# Patient Record
Sex: Male | Born: 1995 | Race: Black or African American | Hispanic: No | Marital: Single | Smoking: Never smoker
Health system: Southern US, Community
[De-identification: ages and names within clinical notes are randomized; demographics above are authoritative.]

---

## 2018-04-09 ENCOUNTER — Emergency Department (HOSPITAL_COMMUNITY)
Admission: EM | Admit: 2018-04-09 | Discharge: 2018-04-09 | Disposition: A | Discharge status: Home / Self Care | Payer: Medicaid Other | Attending: Emergency Medicine | Admitting: Emergency Medicine

## 2018-04-09 ENCOUNTER — Other Ambulatory Visit: Payer: Self-pay

## 2018-04-09 ENCOUNTER — Encounter (HOSPITAL_COMMUNITY): Payer: Self-pay | Admitting: Emergency Medicine

## 2018-04-09 DIAGNOSIS — L237 Allergic contact dermatitis due to plants, except food: Secondary | ICD-10-CM | POA: Insufficient documentation

## 2018-04-09 NOTE — ED Provider Notes (Signed)
[facility] EMERGENCY DEPARTMENT Provider Note   CSN: [id number] Arrival date & time: 04/09/18  2001     History   Chief Complaint Chief Complaint  Patient presents with  . Poison Ivy    HPI Nechemia E Domagalski is a 22 y.o. male.  22 yo male with rash to face/head onset yesterday after working outside removing weeds/poison ivy. Noticed rash along the right cheek and forehead. States area is not really bothersome, not spreading, needs a note to be able to go back to work and would like to go to work. No history of prior complications with poison ivy, no vision changes. Patient tried applying benadryl to area but found this to be more bothersome. No other complaints or concerns.      History reviewed. No pertinent past medical history.  There are no active problems to display for this patient.   History reviewed. No pertinent surgical history.      Home Medications    Prior to Admission medications   Not on File    Family History No family history on file.  Social History Social History   Tobacco Use  . Smoking status: Never Smoker  . Smokeless tobacco: Never Used  Substance Use Topics  . Alcohol use: Not Currently  . Drug use: Not Currently     Allergies   Patient has no known allergies.   Review of Systems Review of Systems  Constitutional: Negative for fever.  Eyes: Negative for visual disturbance.  Musculoskeletal: Negative for arthralgias, joint swelling and myalgias.  Skin: Positive for rash.  Allergic/Immunologic: Negative for immunocompromised state.  All other systems reviewed and are negative.    Physical Exam Updated Vital Signs BP (!) 146/87 (BP Location: Right Arm)   Pulse 75   Temp 98.3 F (36.8 C) (Oral)   Resp 18   Ht 5\' 11"  (1.803 m)   Wt 99.8 kg (220 lb)   SpO2 100%   BMI 30.68 kg/m   Physical Exam  Constitutional: He is oriented to person, place, and time. He appears well-developed and  well-nourished.  HENT:  Head: Normocephalic and atraumatic.  Eyes: Pupils are equal, round, and reactive to light. Conjunctivae and EOM are normal.  Pulmonary/Chest: Effort normal.  Neurological: He is alert and oriented to person, place, and time.  Skin: Skin is warm and dry. Rash noted.     Psychiatric: He has a normal mood and affect. His behavior is normal.  Nursing note and vitals reviewed.    ED Treatments / Results  Labs (all labs ordered are listed, but only abnormal results are displayed) Labs Reviewed - No data to display  EKG None  Radiology No results found.  Procedures Procedures (including critical care time)  Medications Ordered in ED Medications - No data to display   Initial Impression / Assessment and Plan / ED Course  I have reviewed the triage vital signs and the nursing notes.  Pertinent labs & imaging results that were available during my care of the patient were reviewed by me and considered in my medical decision making (see chart for details).  Clinical Course as of Apr 10 2055  Sat Apr 09, 2018  2054 22yo male with exposure to poison ivy with rash to face, states rash is not bothersome but needs note to go to work. Rash does not appear consistent with shingles, does not appear infection, no involvement of eye lids/periorbital swelling. Recommend conservative/symptomatic treatment- benadryl, zyrtec, topical calagel, etc. As needed. Return  to the ER for spreading, eye involvement or other concerns, otherwise- may return to work.    [LM]    Clinical Course User Index [LM] Jeannie FendMurphy, Laura A, PA-C    Final Clinical Impressions(s) / ED Diagnoses   Final diagnoses:  Poison ivy dermatitis    ED Discharge Orders    None       Alden HippMurphy, Laura A, PA-C 04/09/18 2056    Gerhard MunchLockwood, Robert, MD 04/09/18 2356

## 2018-04-09 NOTE — ED Triage Notes (Signed)
Pt reports working with poison ivy Tuesday-Thursday and did not shower after exposure Thursday. yesterday he started having  A rash to his right side of his face and forehead.

## 2018-04-09 NOTE — Discharge Instructions (Addendum)
Clean tools with TECNU, can also bathe with Tecnu after work to remove the poison ivy oils. Keep areas clean, can apply topical cortisone or benadryl if needed. Take Benadryl and Zyrtec as needed for irritation from the rash.  Recheck for worsening or concerning symptoms.

## 2019-08-09 ENCOUNTER — Encounter (HOSPITAL_COMMUNITY): Payer: Self-pay | Admitting: Student

## 2019-08-09 ENCOUNTER — Emergency Department (HOSPITAL_COMMUNITY)
Admission: EM | Admit: 2019-08-09 | Discharge: 2019-08-09 | Disposition: A | Discharge status: Home / Self Care | Payer: Medicaid Other | Attending: Emergency Medicine | Admitting: Emergency Medicine

## 2019-08-09 DIAGNOSIS — U071 COVID-19: Secondary | ICD-10-CM | POA: Diagnosis present

## 2019-08-09 DIAGNOSIS — R438 Other disturbances of smell and taste: Secondary | ICD-10-CM | POA: Insufficient documentation

## 2019-08-09 DIAGNOSIS — R0981 Nasal congestion: Secondary | ICD-10-CM | POA: Insufficient documentation

## 2019-08-09 NOTE — ED Notes (Signed)
PA seen and discharged pt.

## 2019-08-09 NOTE — ED Triage Notes (Signed)
[facility], tested [facility] for [facility].   Is having no smell/taste.  States he always has this problem with seasonal allergies during this time of year.  Pt states it only took 2 min to get results and he wants to make sure results are accurate.

## 2019-08-09 NOTE — ED Provider Notes (Signed)
[facility] EMERGENCY DEPARTMENT Provider Note   CSN: [id number] Arrival date & time: 08/09/19  1631     History   Chief Complaint COVID 19 testing  HPI Willie Jimenez is a 23 y.o. male without significant past medical hx who presents to the ED with concerns regarding a COVID 19 test he had today. He states he has had some nasal congestion with loss of taste/smell x 3 days which [facility]. States tests returned positive within a few minutes, he was concerned about the accuracy of the testing given the rapid resulting prompting ED visit. No alleviating/aggravating factors to his sxs. Denies known exposures to [place]. Denies fever, chills, ear pain, sore throat, cough, dyspnea, chest pain, or myalgias.     HPI  History reviewed. No pertinent past medical history.  There are no active problems to display for this patient.   History reviewed. No pertinent surgical history.      Home Medications    Prior to Admission medications   Not on File    Family History History reviewed. No pertinent family history.  Social History Social History   Tobacco Use  . Smoking status: Never Smoker  . Smokeless tobacco: Never Used  Substance Use Topics  . Alcohol use: Not Currently  . Drug use: Not Currently     Allergies   Patient has no known allergies.   Review of Systems Review of Systems  Constitutional: Negative for chills and fever.  HENT: Positive for congestion. Negative for ear pain and sore throat.        Loss of taste/smell.  Respiratory: Negative for cough and shortness of breath.   Cardiovascular: Negative for chest pain and leg swelling.  Gastrointestinal: Negative for abdominal pain, diarrhea, nausea and vomiting.  Musculoskeletal: Negative for myalgias.  Neurological: Negative for syncope.     Physical Exam Updated Vital Signs BP (!) 147/110 (BP Location: Right Arm)   Pulse 95    Temp 98.9 F (37.2 C) (Oral)   Resp 16   SpO2 98%   Physical Exam Vitals signs and nursing note reviewed.  Constitutional:      General: He is not in acute distress.    Appearance: He is well-developed. He is not toxic-appearing.  HENT:     Head: Normocephalic and atraumatic.     Right Ear: Tympanic membrane is not perforated, erythematous, retracted or bulging.     Left Ear: Tympanic membrane is not perforated, erythematous, retracted or bulging.     Ears:     Comments: No mastoid erythema/swelling/tenderness.     Nose: Nose normal.     Right Sinus: No maxillary sinus tenderness or frontal sinus tenderness.     Left Sinus: No maxillary sinus tenderness or frontal sinus tenderness.     Mouth/Throat:     Mouth: Mucous membranes are moist.     Pharynx: Oropharynx is clear. Uvula midline. No oropharyngeal exudate or posterior oropharyngeal erythema.     Comments: Posterior oropharynx is symmetric appearing. Patient tolerating own secretions without difficulty. No trismus. No drooling. No hot potato voice. No swelling beneath the tongue, submandibular compartment is soft.  Eyes:     General:        Right eye: No discharge.        Left eye: No discharge.     Conjunctiva/sclera: Conjunctivae normal.  Neck:     Musculoskeletal: Neck supple. No neck rigidity.  Cardiovascular:  Rate and Rhythm: Normal rate and regular rhythm.  Pulmonary:     Effort: Pulmonary effort is normal. No respiratory distress.     Breath sounds: Normal breath sounds. No wheezing, rhonchi or rales.  Abdominal:     General: There is no distension.     Palpations: Abdomen is soft.     Tenderness: There is no abdominal tenderness.  Lymphadenopathy:     Cervical: No cervical adenopathy.  Skin:    General: Skin is warm and dry.     Findings: No rash.  Neurological:     Mental Status: He is alert.     Comments: Clear speech.   Psychiatric:        Behavior: Behavior normal.      ED Treatments / Results   Labs (all labs ordered are listed, but only abnormal results are displayed) Labs Reviewed - No data to display  EKG None  Radiology No results found.  Procedures Procedures (including critical care time)  Medications Ordered in ED Medications - No data to display   Initial Impression / Assessment and Plan / ED Course  I have reviewed the triage vital signs and the nursing notes.  Pertinent labs & imaging results that were available during my care of the patient were reviewed by me and considered in my medical decision making (see chart for details).   Patient presents to the ED w/ concerns regarding the accuracy of positive results of a rapid covid swab he [facility] earlier today. Sxs include nasal congestion & loss of taste/smell. Vitals WNL with the exception of elevated BP- doubt HTN emergency. Exam is benign. ENT exam w/o signs of bacterial infection. No meningismus. Lungs CTA w/o respiratory distress. With sxs patient reports & positive test earlier today do not feel that repeat testing is necessary. Extensive education/counseling performed regarding COVID 19, testing, & quarantine. Patient appears appropriate for discharge home. I discussed plan, need for follow-up, and return precautions with the patient. Provided opportunity for questions, patient confirmed understanding and is in agreement with plan.   Willie Jimenez was evaluated in Emergency Department on 08/09/2019 for the symptoms described in the history of present illness. He/she was evaluated in the context of the global COVID-19 pandemic, which necessitated consideration that the patient might be at risk for infection with the SARS-CoV-2 virus that causes COVID-19. Institutional protocols and algorithms that pertain to the evaluation of patients at risk for COVID-19 are in a state of rapid change based on information released by regulatory bodies including the CDC and federal and state organizations. These  policies and algorithms were followed during the patient's care in the ED.   Final Clinical Impressions(s) / ED Diagnoses   Final diagnoses:  COVID-19    ED Discharge Orders    None       Cherly Anderson, PA-C 08/09/19 1951    Little, Ambrose Finland, MD 08/09/19 [phone]

## 2019-08-09 NOTE — Discharge Instructions (Signed)
You have tested positive for COVID 19. We are instructing individuals with COVID 19 to quarantine themselves for 14 days. You may be able to discontinue self quarantine if the following conditions are met:   Persons with COVID-19 who have symptoms and were directed to care for themselves at home may discontinue home isolation under the  following conditions: - It has been at least 7 days have passed since symptoms first appeared. - AND at least 3 days (72 hours) have passed since recovery defined as resolution of fever without the use of fever-reducing medications and improvement in respiratory symptoms (e.g., cough, shortness of breath)  Please follow the below quarantine instructions.   Please follow up with primary care within 1-2 weeks for re-evaluation- call as opposed to going to the office to discuss what type of appointment to make in order to make them aware of your symptoms. Return to the ER for new or worsening symptoms including but not limited to increased work of breathing, chest pain, passing out, or any other concerns.       Person Under Monitoring Name: Willie Jimenez  Location: [place] [address]   Infection Prevention Recommendations for Individuals Confirmed to have, or Being Evaluated for, 2019 Novel Coronavirus (COVID-19) Infection Who Receive Care at Home  Individuals who are confirmed to have, or are being evaluated for, COVID-19 should follow the prevention steps below until a healthcare provider or local or state health department says they can return to normal activities.  Stay home except to get medical care You should restrict activities outside your home, except for getting medical care. Do not go to work, school, or public areas, and do not use public transportation or taxis.  Call ahead before visiting your doctor Before your medical appointment, call the healthcare provider and tell them that you have, or are being evaluated  for, COVID-19 infection. This will help the healthcare providers office take steps to keep other people from getting infected. Ask your healthcare provider to call the local or state health department.  Monitor your symptoms Seek prompt medical attention if your illness is worsening (e.g., difficulty breathing). Before going to your medical appointment, call the healthcare provider and tell them that you have, or are being evaluated for, COVID-19 infection. Ask your healthcare provider to call the local or state health department.  Wear a facemask You should wear a facemask that covers your nose and mouth when you are in the same room with other people and when you visit a healthcare provider. People who live with or visit you should also wear a facemask while they are in the same room with you.  Separate yourself from other people in your home As much as possible, you should stay in a different room from other people in your home. Also, you should use a separate bathroom, if available.  Avoid sharing household items You should not share dishes, drinking glasses, cups, eating utensils, towels, bedding, or other items with other people in your home. After using these items, you should wash them thoroughly with soap and water.  Cover your coughs and sneezes Cover your mouth and nose with a tissue when you cough or sneeze, or you can cough or sneeze into your sleeve. Throw used tissues in a lined trash can, and immediately wash your hands with soap and water for at least 20 seconds or use an alcohol-based hand rub.  Wash your [facility] your hands often and thoroughly with soap  and water for at least 20 seconds. You can use an alcohol-based hand sanitizer if soap and water are not available and if your hands are not visibly dirty. Avoid touching your eyes, nose, and mouth with unwashed hands.   Prevention Steps for Caregivers and Household Members of Individuals Confirmed to have, or  Being Evaluated for, COVID-19 Infection Being Cared for in the Home  If you live with, or provide care at home for, a person confirmed to have, or being evaluated for, COVID-19 infection please follow these guidelines to prevent infection:  Follow healthcare providers instructions Make sure that you understand and can help the patient follow any healthcare provider instructions for all care.  Provide for the patients basic needs You should help the patient with basic needs in the home and provide support for getting groceries, prescriptions, and other personal needs.  Monitor the patients symptoms If they are getting sicker, call his or her medical provider and tell them that the patient has, or is being evaluated for, COVID-19 infection. This will help the healthcare providers office take steps to keep other people from getting infected. Ask the healthcare provider to call the local or state health department.  Limit the number of people who have contact with the patient If possible, have only one caregiver for the patient. Other household members should stay in another home or place of residence. If this is not possible, they should stay in another room, or be separated from the patient as much as possible. Use a separate bathroom, if available. Restrict visitors who do not have an essential need to be in the home.  Keep older adults, very young children, and other sick people away from the patient Keep older adults, very young children, and those who have compromised immune systems or chronic health conditions away from the patient. This includes people with chronic heart, lung, or kidney conditions, diabetes, and cancer.  Ensure good ventilation Make sure that shared spaces in the home have good air flow, such as from an air conditioner or an opened window, weather permitting.  Wash your hands often Wash your hands often and thoroughly with soap and water for at least 20  seconds. You can use an alcohol based hand sanitizer if soap and water are not available and if your hands are not visibly dirty. Avoid touching your eyes, nose, and mouth with unwashed hands. Use disposable paper towels to dry your hands. If not available, use dedicated cloth towels and replace them when they become wet.  Wear a facemask and gloves Wear a disposable facemask at all times in the room and gloves when you touch or have contact with the patients blood, body fluids, and/or secretions or excretions, such as sweat, saliva, sputum, nasal mucus, vomit, urine, or feces.  Ensure the mask fits over your nose and mouth tightly, and do not touch it during use. Throw out disposable facemasks and gloves after using them. Do not reuse. Wash your hands immediately after removing your facemask and gloves. If your personal clothing becomes contaminated, carefully remove clothing and launder. Wash your hands after handling contaminated clothing. Place all used disposable facemasks, gloves, and other waste in a lined container before disposing them with other household waste. Remove gloves and wash your hands immediately after handling these items.  Do not share dishes, glasses, or other household items with the patient Avoid sharing household items. You should not share dishes, drinking glasses, cups, eating utensils, towels, bedding, or other items with a  patient who is confirmed to have, or being evaluated for, COVID-19 infection. After the person uses these items, you should wash them thoroughly with soap and water.  Wash laundry thoroughly Immediately remove and wash clothes or bedding that have blood, body fluids, and/or secretions or excretions, such as sweat, saliva, sputum, nasal mucus, vomit, urine, or feces, on them. Wear gloves when handling laundry from the patient. Read and follow directions on labels of laundry or clothing items and detergent. In general, wash and dry with the warmest  temperatures recommended on the label.  Clean all areas the individual has used often Clean all touchable surfaces, such as counters, tabletops, doorknobs, bathroom fixtures, toilets, phones, keyboards, tablets, and bedside tables, every day. Also, clean any surfaces that may have blood, body fluids, and/or secretions or excretions on them. Wear gloves when cleaning surfaces the patient has come in contact with. Use a diluted bleach solution (e.g., dilute bleach with 1 part bleach and 10 parts water) or a household disinfectant with a label that says EPA-registered for coronaviruses. To make a bleach solution at home, add 1 tablespoon of bleach to 1 quart (4 cups) of water. For a larger supply, add  cup of bleach to 1 gallon (16 cups) of water. Read labels of cleaning products and follow recommendations provided on product labels. Labels contain instructions for safe and effective use of the cleaning product including precautions you should take when applying the product, such as wearing gloves or eye protection and making sure you have good ventilation during use of the product. Remove gloves and wash hands immediately after cleaning.  Monitor yourself for signs and symptoms of illness Caregivers and household members are considered close contacts, should monitor their health, and will be asked to limit movement outside of the home to the extent possible. Follow the monitoring steps for close contacts listed on the symptom monitoring form.   ? If you have additional questions, contact your local health department or call the epidemiologist on call at [phone] (available 24/7). ? This guidance is subject to change. For the most up-to-date guidance from [facility], please refer to their website: YouBlogs.pl

## 2019-08-10 ENCOUNTER — Other Ambulatory Visit: Payer: Self-pay

## 2019-12-15 ENCOUNTER — Encounter (HOSPITAL_COMMUNITY): Payer: Self-pay

## 2019-12-15 ENCOUNTER — Other Ambulatory Visit: Payer: Self-pay

## 2019-12-15 ENCOUNTER — Ambulatory Visit (HOSPITAL_COMMUNITY)
Admission: EM | Admit: 2019-12-15 | Discharge: 2019-12-15 | Disposition: A | Discharge status: Home / Self Care | Payer: Medicaid Other | Attending: Family Medicine | Admitting: Family Medicine

## 2019-12-15 ENCOUNTER — Ambulatory Visit (INDEPENDENT_AMBULATORY_CARE_PROVIDER_SITE_OTHER): Payer: Medicaid Other

## 2019-12-15 DIAGNOSIS — K639 Disease of intestine, unspecified: Secondary | ICD-10-CM

## 2019-12-15 DIAGNOSIS — R1032 Left lower quadrant pain: Secondary | ICD-10-CM | POA: Diagnosis not present

## 2019-12-15 MED ORDER — HYOSCYAMINE SULFATE SL 0.125 MG SL SUBL: 1.0000 | SUBLINGUAL_TABLET | Freq: Three times a day (TID) | SUBLINGUAL | 1 refills | Status: AC | PRN

## 2019-12-15 NOTE — ED Provider Notes (Signed)
[facility]    CSN: [id number] Arrival date & time: 12/15/19  1932      History   Chief Complaint Chief Complaint  Patient presents with  . Abdominal Pain    HPI Willie Jimenez is a 24 y.o. male.   Iniital [facility] visit  Patient complaining of LUQ pain for 2 months however today has been the worst. 8/10 pain today along with dizziness and lightheadedness.   No injury.  Some loose stools.  Slight nausea.  No vomiting or fever.     History reviewed. No pertinent past medical history.  There are no problems to display for this patient.   History reviewed. No pertinent surgical history.     Home Medications    Prior to Admission medications   Not on File    Family History History reviewed. No pertinent family history.  Social History Social History   Tobacco Use  . Smoking status: Never Smoker  . Smokeless tobacco: Never Used  Substance Use Topics  . Alcohol use: Yes    Comment: 4-5x a month  . Drug use: Not Currently     Allergies   Patient has no known allergies.   Review of Systems Review of Systems  Constitutional: Negative.   Respiratory: Negative.   Cardiovascular: Negative.   Gastrointestinal: Positive for abdominal pain and diarrhea. Negative for abdominal distention, blood in stool, nausea and vomiting.  Genitourinary: Negative.   Musculoskeletal: Negative.   Skin: Negative.   All other systems reviewed and are negative.    Physical Exam Triage Vital Signs ED Triage Vitals  Enc Vitals Group     BP 12/15/19 1941 (!) 154/86     Pulse Rate 12/15/19 1941 76     Resp 12/15/19 1941 18     Temp 12/15/19 1941 98.2 F (36.8 C)     Temp Source 12/15/19 1941 Oral     SpO2 12/15/19 1941 100 %     Weight 12/15/19 1942 234 lb (106.1 kg)     Height --      Head Circumference --      Peak Flow --      Pain Score 12/15/19 1942 8     Pain Loc --      Pain Edu? --      Excl. in GC? --    No data found.  Updated Vital  Signs BP (!) 154/86 (BP Location: Left Arm)   Pulse 76   Temp 98.2 F (36.8 C) (Oral)   Resp 18   Wt 106.1 kg   SpO2 100%   BMI 32.64 kg/m    Physical Exam Vitals and nursing note reviewed.  Constitutional:      General: He is not in acute distress.    Appearance: He is well-developed. He is obese. He is not ill-appearing or toxic-appearing.  HENT:     Head: Normocephalic.     Mouth/Throat:     Mouth: Mucous membranes are moist.  Cardiovascular:     Rate and Rhythm: Normal rate and regular rhythm.  Pulmonary:     Effort: Pulmonary effort is normal.     Breath sounds: Normal breath sounds.  Abdominal:     General: Abdomen is flat. Bowel sounds are normal.     Palpations: Abdomen is soft.     Tenderness: There is abdominal tenderness in the left upper quadrant. There is no right CVA tenderness, left CVA tenderness, guarding or rebound.  Skin:    General: Skin is warm  and dry.  Neurological:     General: No focal deficit present.     Mental Status: He is alert.  Psychiatric:        Mood and Affect: Mood normal.      UC Treatments / Results  Labs (all labs ordered are listed, but only abnormal results are displayed) Labs Reviewed - No data to display  EKG   Radiology DG Abd Acute W/Chest  Result Date: 12/15/2019 CLINICAL DATA:  Left lower quadrant pain x2 months. EXAM: DG ABDOMEN ACUTE W/ 1V CHEST COMPARISON:  None. FINDINGS: There is no evidence of dilated bowel loops or free intraperitoneal air. No radiopaque calculi or other significant radiographic abnormality is seen. Heart size and mediastinal contours are within normal limits. Both lungs are clear. IMPRESSION: Negative abdominal radiographs.  No acute cardiopulmonary disease. Electronically Signed   By: Constance Holster M.D.   On: 12/15/2019 20:23    Procedures Procedures (including critical care time)  Medications Ordered in UC Medications - No data to display  Initial Impression / Assessment and  Plan / UC Course  I have reviewed the triage vital signs and the nursing notes.  Pertinent labs & imaging results that were available during my care of the patient were reviewed by me and considered in my medical decision making (see chart for details).    Final Clinical Impressions(s) / UC Diagnoses   Final diagnoses:  None   Discharge Instructions   None    ED Prescriptions    None     I have reviewed the PDMP during this encounter.   Robyn Haber, MD 12/15/19 2029

## 2019-12-15 NOTE — Discharge Instructions (Addendum)
This appears to be a relatively minor problem with a kink in the left upper colon (large intestine).  I have prescribed medicine to relieve the immediate pain.  Try to increase the fiber in your diet:  vegetables and fruits, less fast food.

## 2019-12-15 NOTE — ED Triage Notes (Signed)
Patient complaining of LUQ pain for 2 months however today has been the worst. 8/10 pain today along with dizziness and lightheadedness.

## 2020-05-22 ENCOUNTER — Ambulatory Visit: Payer: Medicaid Other | Attending: Family

## 2020-05-22 DIAGNOSIS — Z23 Encounter for immunization: Secondary | ICD-10-CM

## 2020-05-31 NOTE — Progress Notes (Signed)
   Covid-[facility] Vaccination [facility]  Name:  Willie Jimenez    MRN: [id number] DOB: 1996-07-01  05/31/2020  [name] was observed post Covid-19 immunization for 15 minutes without incident. He was provided with Vaccine Information Sheet and instruction to access the V-Safe system.   [name] was instructed to call 911 with any severe reactions post vaccine: Marland Kitchen Difficulty breathing  . Swelling of face and throat  . A fast heartbeat  . A bad rash all over body  . Dizziness and weakness   Immunizations Administered    Name Date Dose VIS Date Route   Moderna COVID-19 Vaccine 05/22/2020  4:30 PM 0.5 mL 08/2019 Intramuscular   Manufacturer: Gala Murdoch   Lot: [id number]   NDC: 38381-840-37

## 2020-06-19 ENCOUNTER — Ambulatory Visit: Payer: Medicaid Other | Attending: Family

## 2020-06-19 DIAGNOSIS — Z23 Encounter for immunization: Secondary | ICD-10-CM

## 2020-11-26 IMAGING — DX DG ABDOMEN ACUTE W/ 1V CHEST
3 series · 3 of 3 positions shown · non-contrast
Comparison: None.

CLINICAL DATA: Left lower quadrant pain x2 months.

EXAM:
DG ABDOMEN ACUTE W/ 1V CHEST

[chest pa]
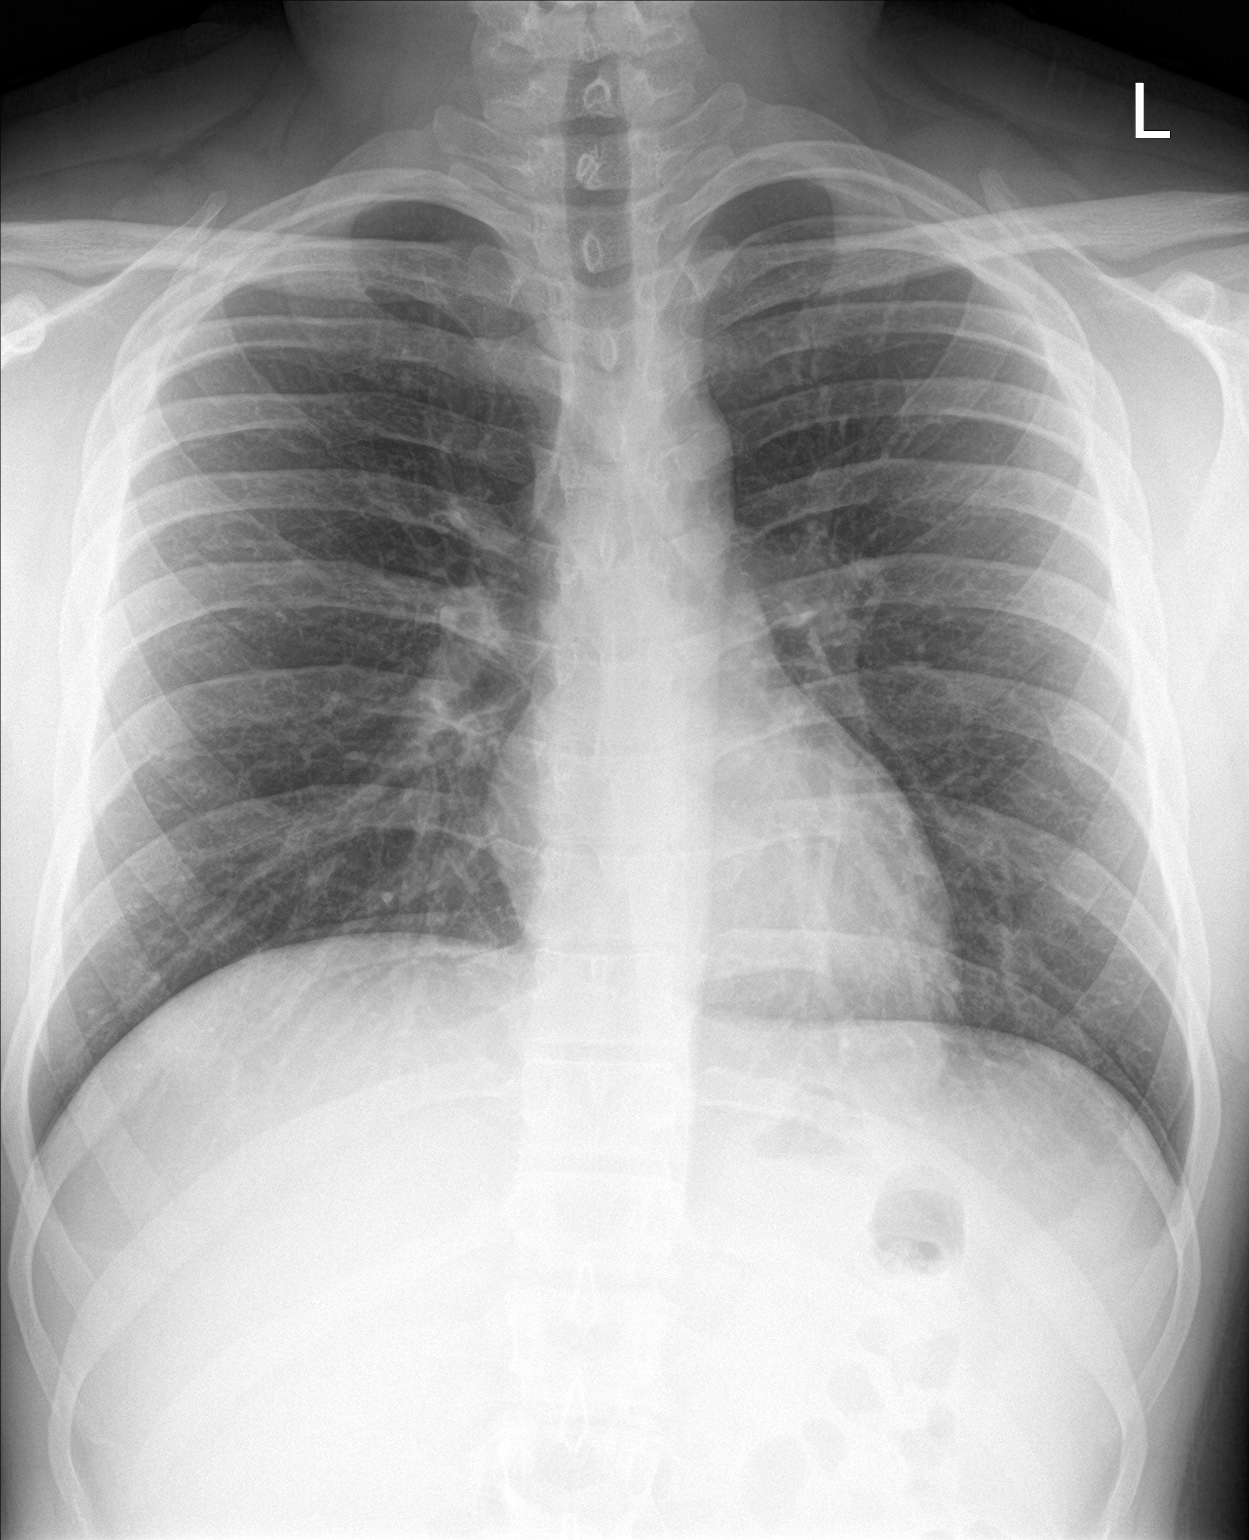

[abdomen erect]
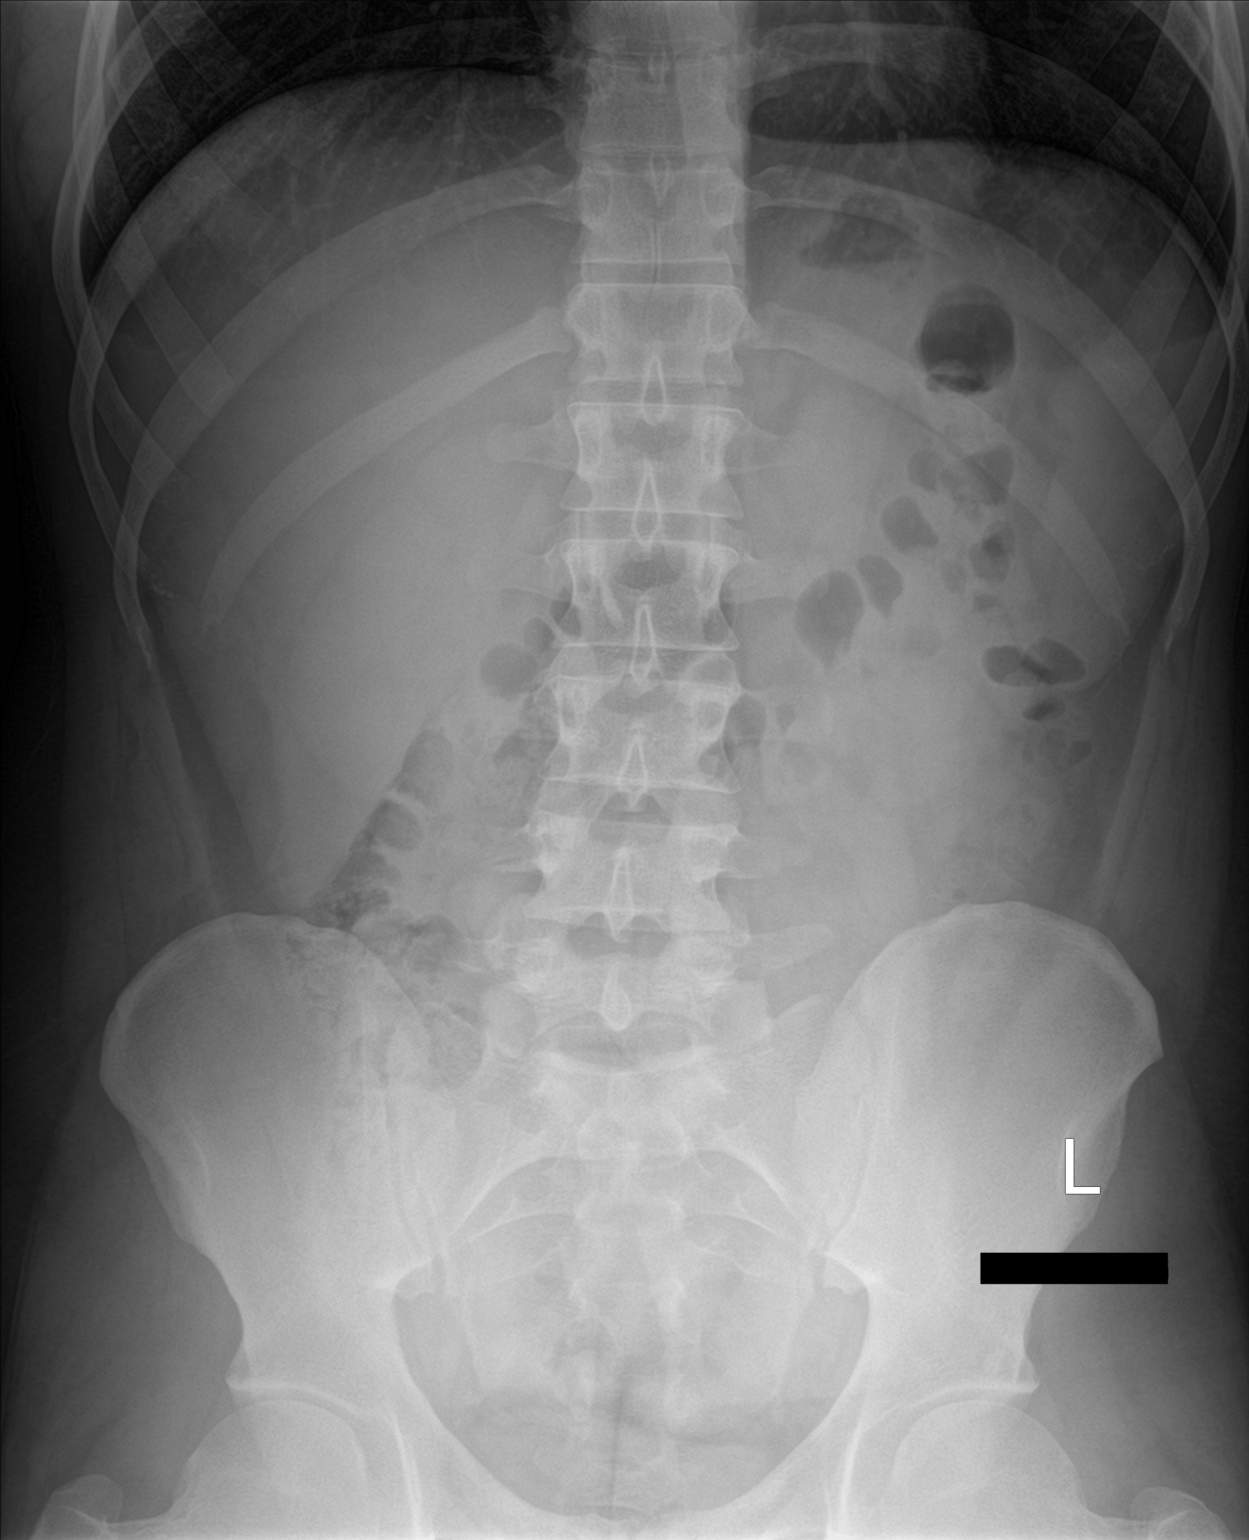

[abdomen supine]
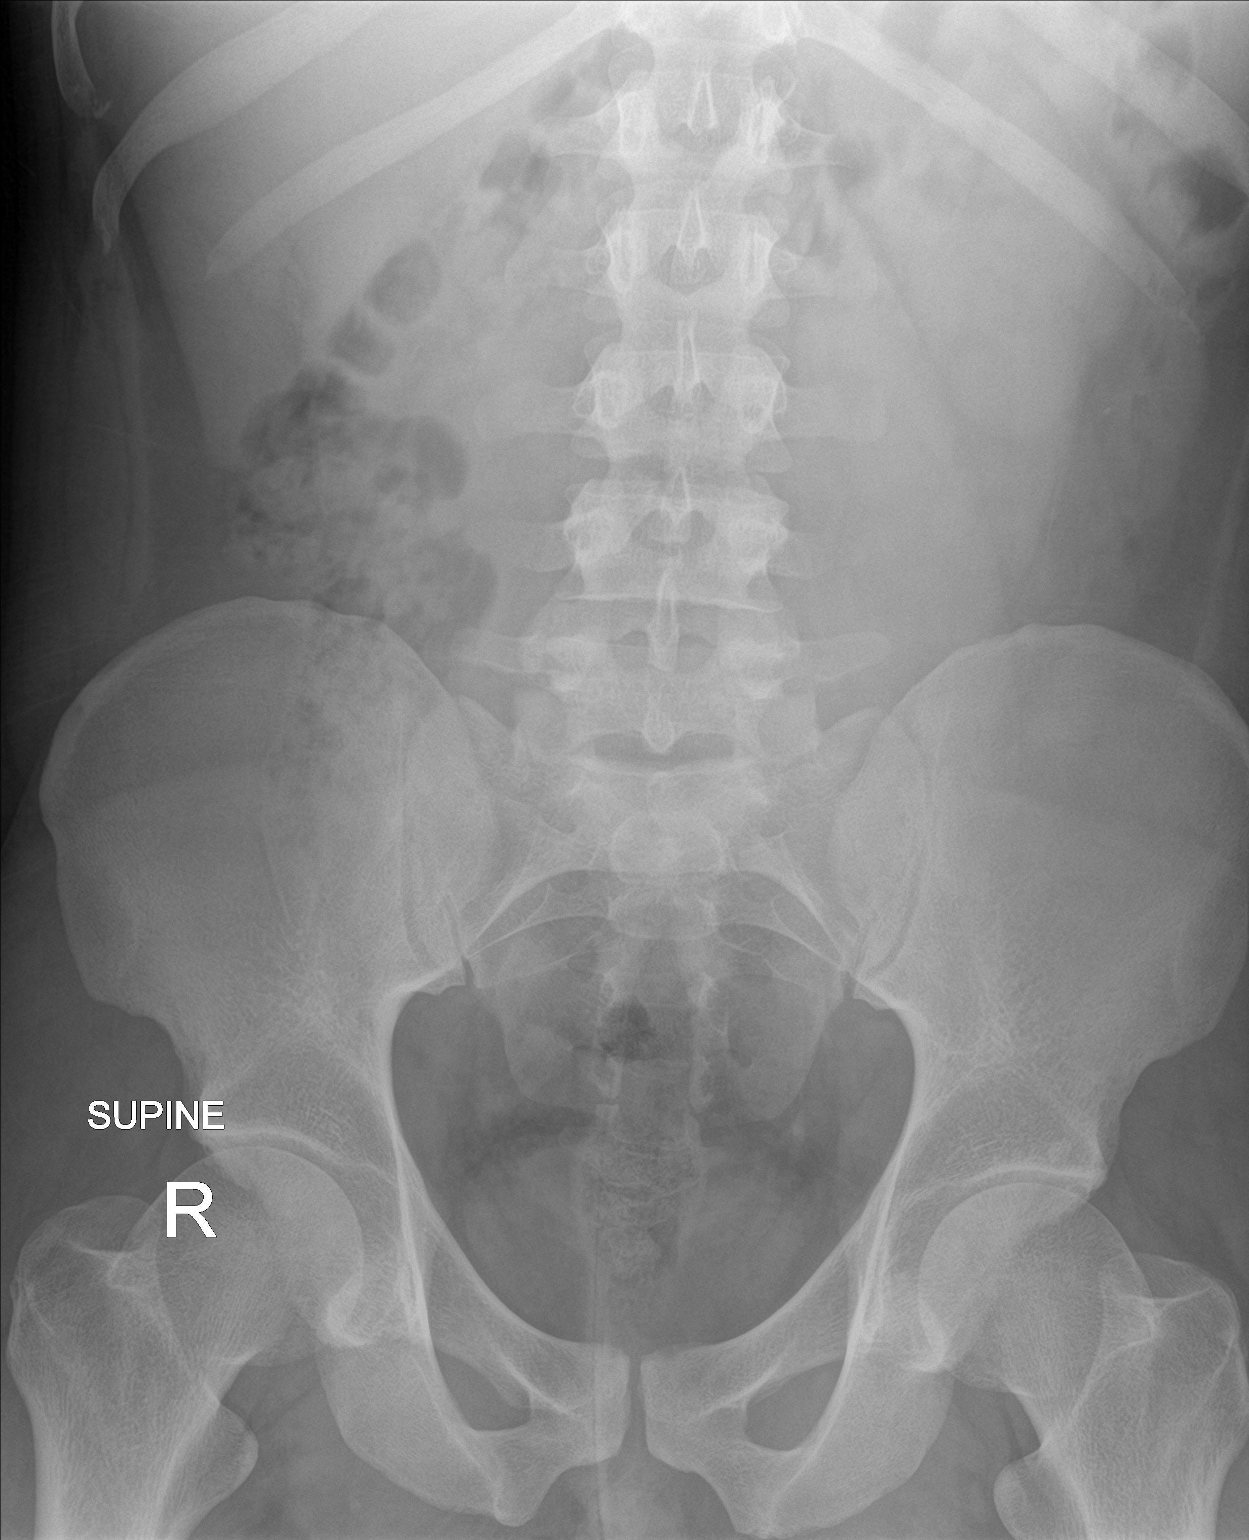

[3 of 3 positions shown; findings below may reference images not displayed]

FINDINGS: There is no evidence of dilated bowel loops or free intraperitoneal
air. No radiopaque calculi or other significant radiographic
abnormality is seen. Heart size and mediastinal contours are within
normal limits. Both lungs are clear.
IMPRESSION: Negative abdominal radiographs.  No acute cardiopulmonary disease.
